# Patient Record
Sex: Female | Born: 2000 | Race: White | Hispanic: No
Health system: Western US, Academic
[De-identification: ages and names within clinical notes are randomized; demographics above are authoritative.]

## PROBLEM LIST (undated history)

## (undated) HISTORY — PX: BREAST RECONSTRUCTION: SHX5075

---
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
## 2018-02-19 ENCOUNTER — Ambulatory Visit (INDEPENDENT_AMBULATORY_CARE_PROVIDER_SITE_OTHER): Payer: No Typology Code available for payment source | Admitting: Family Medicine

## 2018-02-19 ENCOUNTER — Encounter: Payer: Self-pay | Admitting: Family Medicine

## 2018-02-19 ENCOUNTER — Other Ambulatory Visit: Payer: Self-pay | Admitting: Family Medicine

## 2018-02-19 VITALS — BP 103/52 | HR 49 | Temp 98.5°F | Resp 14 | Wt 191.0 lb

## 2018-02-19 DIAGNOSIS — N911 Secondary amenorrhea: Secondary | ICD-10-CM

## 2018-02-19 NOTE — Progress Notes (Addendum)
Patient presents today with symptoms of amenorrhea.  Patient states that she has had no menstrual period since November 27, 2017.  She denies being sexually active.  She admits to having symptoms related to her menstrual cycle but no bleeding.  Patient has changed her diet over the last year with preparing food mostly at home.  She has lost approximately 30 pounds over the course of the year.  She attributes this to mostly increased activity with basketball.  She denies any history of stress injury.  She denies any problems with her sleep or appetite.  She does admit to some constipation.  She denies any abdominal pain, chest pain, shortness of breath, headache, vision changes, breast changes, nipple discharge, nausea, vomiting.  She denies any restrictive or binging/purging behavior.  She does admit that she is doing more basketball activity than she ever has done before.  She has met with a nutritionist who encouraged her to come and see me to talk about her amenorrhea and to do some labs.  ROS: Negative except mentioned above. Vitals as per Epic.  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  Will follow-up with patient after lab results have been reviewed.

## 2018-02-20 LAB — CBC WITH DIFFERENTIAL/PLATELET
BASOS: 0 %
Basophils Absolute: 0 10*3/uL (ref 0.0–0.2)
EOS (ABSOLUTE): 0.1 10*3/uL (ref 0.0–0.4)
EOS: 1 %
HEMATOCRIT: 34.1 % (ref 34.0–46.6)
Hemoglobin: 11.7 g/dL (ref 11.1–15.9)
Immature Grans (Abs): 0 10*3/uL (ref 0.0–0.1)
Immature Granulocytes: 0 %
LYMPHS ABS: 1.4 10*3/uL (ref 0.7–3.1)
Lymphs: 16 %
MCH: 29.9 pg (ref 26.6–33.0)
MCHC: 34.3 g/dL (ref 31.5–35.7)
MCV: 87 fL (ref 79–97)
MONOS ABS: 0.4 10*3/uL (ref 0.1–0.9)
Monocytes: 4 %
Neutrophils Absolute: 6.5 10*3/uL (ref 1.4–7.0)
Neutrophils: 79 %
Platelets: 318 10*3/uL (ref 150–450)
RBC: 3.91 x10E6/uL (ref 3.77–5.28)
RDW: 12.2 % — AB (ref 12.3–15.4)
WBC: 8.4 10*3/uL (ref 3.4–10.8)

## 2018-02-20 LAB — COMPREHENSIVE METABOLIC PANEL
A/G RATIO: 1.9 (ref 1.2–2.2)
ALK PHOS: 58 IU/L (ref 39–117)
ALT: 15 IU/L (ref 0–32)
AST: 20 IU/L (ref 0–40)
Albumin: 4.3 g/dL (ref 3.5–5.5)
BUN/Creatinine Ratio: 14 (ref 9–23)
BUN: 12 mg/dL (ref 6–20)
Bilirubin Total: 0.4 mg/dL (ref 0.0–1.2)
CHLORIDE: 102 mmol/L (ref 96–106)
CO2: 21 mmol/L (ref 20–29)
Calcium: 8.9 mg/dL (ref 8.7–10.2)
Creatinine, Ser: 0.88 mg/dL (ref 0.57–1.00)
GFR calc Af Amer: 110 mL/min/{1.73_m2} (ref >59)
GFR, EST NON AFRICAN AMERICAN: 96 mL/min/{1.73_m2} (ref >59)
GLOBULIN, TOTAL: 2.3 g/dL (ref 1.5–4.5)
Glucose: 75 mg/dL (ref 65–99)
POTASSIUM: 4.1 mmol/L (ref 3.5–5.2)
SODIUM: 140 mmol/L (ref 134–144)
Total Protein: 6.6 g/dL (ref 6.0–8.5)

## 2018-02-20 LAB — FSH/LH
FSH: 7.8 m[IU]/mL
LH: 5.9 m[IU]/mL

## 2018-02-20 LAB — VITAMIN D 25 HYDROXY (VIT D DEFICIENCY, FRACTURES): VIT D 25 HYDROXY: 25.8 ng/mL — AB (ref 30.0–100.0)

## 2018-02-20 LAB — TSH+FREE T4
Free T4: 1.04 ng/dL (ref 0.93–1.60)
TSH: 0.857 u[IU]/mL (ref 0.450–4.500)

## 2018-02-20 LAB — ESTRADIOL: Estradiol: 14.8 pg/mL

## 2018-03-08 ENCOUNTER — Other Ambulatory Visit: Payer: Self-pay | Admitting: Family Medicine

## 2018-03-08 DIAGNOSIS — N911 Secondary amenorrhea: Secondary | ICD-10-CM

## 2018-03-12 ENCOUNTER — Encounter: Payer: Self-pay | Admitting: Obstetrics and Gynecology

## 2018-03-21 ENCOUNTER — Encounter: Payer: Self-pay | Admitting: Obstetrics and Gynecology

## 2018-03-23 ENCOUNTER — Ambulatory Visit: Payer: PRIVATE HEALTH INSURANCE | Admitting: Certified Nurse Midwife

## 2018-03-23 ENCOUNTER — Encounter: Payer: Self-pay | Admitting: Certified Nurse Midwife

## 2018-03-23 VITALS — BP 100/55 | HR 56 | Ht 73.0 in | Wt 196.0 lb

## 2018-03-23 DIAGNOSIS — N911 Secondary amenorrhea: Secondary | ICD-10-CM | POA: Diagnosis not present

## 2018-03-23 NOTE — Patient Instructions (Addendum)
Secondary Amenorrhea  Secondary amenorrhea occurs when a female who was previously having menstrual periods has not had them for 3-6 months. A menstrual period is the monthly shedding of the lining of the uterus. Menstruation involves the passing of blood, tissue, fluid, and mucus through the vagina. The flow of blood usually occurs during 3-7 consecutive days each month. This condition has many causes. In many cases, treating the underlying cause will return menstrual periods back to a normal cycle. What are the causes? The most common cause of this condition is pregnancy. Other causes include:  Malnutrition.  Cirrhosis of the liver.  Conditions of the blood.  Diabetes.  Epilepsy.  Chronic kidney disease.  Polycystic ovary disease.  Stress or anxiety.  A hormonal imbalance.  Ovarian failure.  Medicines.  Extreme obesity.  Cystic fibrosis.  Low body weight or drastic weight loss.  Early menopause.  Removal of the ovaries or uterus.  Contraceptive pills, patches, or vaginal rings.  Cushing syndrome.  Thyroid problems. What increases the risk? You are more likely to develop this condition if:  You have a family history of this condition.  You have an eating disorder.  You do extreme athletic training.  You have a chronic disease.  You abuse substances such as alcohol or cigarettes. What are the signs or symptoms? The main symptom of this condition is a lack of menstrual periods for 3-6 months. How is this diagnosed? This condition may be diagnosed based on:  Your medical history.  A physical exam.  A pelvic exam to check for problems with your reproductive organs.  A procedure to examine the uterus.  A measurement of your body mass index (BMI).  Tests, such as: ? Blood tests that measure certain hormones in your body and rule out pregnancy. ? Urine tests. ? Imaging tests, such as an ultrasound, CT scan, or MRI. How is this treated? Treatment  for this condition depends on the cause of the amenorrhea. It may involve:  Correcting dietary problems.  Treating underlying conditions.  Medicines.  Lifestyle changes.  Surgery. If the condition cannot be corrected, it is sometimes possible to trigger menstrual periods with medicines. Follow these instructions at home: Lifestyle  Maintain a healthy diet. Ask to meet with a registered dietitian for nutrition counseling and meal planning.  Maintain a healthy weight. Talk to your health care provider before trying any new diet or exercise plan.  Exercise at least 30 minutes 5 or more days each week. Exercising includes brisk walking, yard work, biking, running, swimming, and team sports like basketball and soccer. Ask your health care provider which exercises are safe for you.  Get enough sleep. Plan your sleep time to allow for 7-9 hours of sleep each night.  Learn to manage stress. Explore relaxation techniques such as meditation, journaling, yoga, or tai chi. General instructions  Be aware of changes in your menstrual cycle. Keep a record of when you have your menstrual period. Note the date your period starts, how long it lasts, and any problems you experience.  Take over-the-counter and prescription medicines only as told by your health care provider.  Keep all follow-up visits as told by your health care provider. This is important. Contact a health care provider if:  Your periods do not return to normal after treatment. Summary  Secondary amenorrhea is when a female who was previously having menstrual periods has not gotten her period for 3-6 months.  This condition has many causes. In many cases, treating the underlying   cause will return menstrual periods back to a normal cycle.  Talk to your health care provider if your periods do not return to normal after treatment. This information is not intended to replace advice given to you by your health care provider. Make  sure you discuss any questions you have with your health care provider. Document Released: 05/02/2006 Document Revised: 06/09/2016 Document Reviewed: 06/09/2016 Elsevier Interactive Patient Education  2019 [employer]. Medroxyprogesterone tablets What is this medicine? MEDROXYPROGESTERONE (me DROX ee proe JES te rone) is a hormone in a class called progestins. It is commonly used to prevent the uterine lining from overgrowth in women taking an estrogen after menopause. It is also used to treat irregular menstrual bleeding or a lack of menstrual bleeding in women. This medicine may be used for other purposes; ask your health care provider or pharmacist if you have questions. COMMON BRAND NAME(S): Amen, Provera What should I tell my health care provider before I take this medicine? They need to know if you have any of these conditions: -blood vessel disease or a history of a blood clot in the lungs or legs -breast, cervical or vaginal cancer -heart disease -kidney disease -liver disease -migraine -recent miscarriage or abortion -mental depression -migraine -seizures (convulsions) -stroke -vaginal bleeding that has not been evaluated -an unusual or allergic reaction to medroxyprogesterone, other medicines, foods, dyes, or preservatives -pregnant or trying to get pregnant -breast-feeding How should I use this medicine? Take this medicine by mouth with a glass of water. Follow the directions on the prescription label. Take your doses at regular intervals. Do not take your medicine more often than directed. Talk to your pediatrician regarding the use of this medicine in children. Special care may be needed. While this drug may be prescribed for children as young as 13 years for selected conditions, precautions do apply. Overdosage: If you think you have taken too much of this medicine contact a poison control center or emergency room at once. NOTE: This medicine is only for you. Do not share  this medicine with others. What if I miss a dose? If you miss a dose, take it as soon as you can. If it is almost time for your next dose, take only that dose. Do not take double or extra doses. What may interact with this medicine? -barbiturate medicines for inducing sleep or treating seizures (convulsions) -bosentan -carbamazepine -phenytoin -rifampin -St. John's Wort This list may not describe all possible interactions. Give your health care provider a list of all the medicines, herbs, non-prescription drugs, or dietary supplements you use. Also tell them if you smoke, drink alcohol, or use illegal drugs. Some items may interact with your medicine. What should I watch for while using this medicine? Visit your health care professional for regular checks on your progress. You will need a regular breast and pelvic exam. If you have any reason to think you are pregnant, stop taking this medicine at once and contact your doctor or health care professional. What side effects may I notice from receiving this medicine? Side effects that you should report to your doctor or health care professional as soon as possible: -breast tenderness or discharge -changes in mood or emotions, such as depression -changes in vision or speech -pain in the abdomen, chest, groin, or leg -severe headache -skin rash, itching, or hives -sudden shortness of breath -unusually weak or tired -yellowing of skin or eyes Side effects that usually do not require medical attention (report to your doctor or health  care professional if they continue or are bothersome): -acne -change in menstrual bleeding pattern or flow -changes in sexual desire -facial hair growth -fluid retention and swelling -headache -upset stomach -weight gain or loss This list may not describe all possible side effects. Call your doctor for medical advice about side effects. You may report side effects to FDA at 1-800-FDA-1088. Where should I keep  my medicine? Keep out of the reach of children. Store at room temperature between 20 and 25 degrees C (68 and 77 degrees F). Throw away any unused medicine after the expiration date. NOTE: This sheet is a summary. It may not cover all possible information. If you have questions about this medicine, talk to your doctor, pharmacist, or health care provider.  2019 Elsevier/Gold Standard (2008-03-20 11:26:12)

## 2018-03-23 NOTE — Progress Notes (Signed)
GYN ENCOUNTER NOTE  Subjective:       Patricia Ballard is a 17 y.o. G0P0000 female is here for gynecologic evaluation of the following issues:  1. Secondary amenorrhea  [facility]. Labs collected. Reports 35 pound weight loss over the last year.   Patient's mother contacted gynecologist at home (she's an Geographical information systems officerinternational student) and was given progesterone which the patient has not started yet.   Denies difficulty breathing or respiratory distress, chest pain, abdominal pain, vaginal bleeding, dysuria, and leg pain or swelling.    Gynecologic History  Patient's last menstrual period was 11/27/2017 (exact date).  Period Cycle (Days): 28 Period Duration (Days): 5 Period Pattern: Regular Menstrual Flow: Moderate Menstrual Control: Maxi pad Dysmenorrhea: None  Contraception: abstinence  Last Pap: N/A.   Obstetric History  OB History  Gravida Para Term Preterm AB Living  0 0 0 0 0 0  SAB TAB Ectopic Multiple Live Births  0 0 0 0 0    No past medical history on file.  Past Surgical History:  Procedure Laterality Date  . TONSILLECTOMY  2007    Current Outpatient Medications on File Prior to Visit  Medication Sig Dispense Refill  . cholecalciferol (VITAMIN D3) 25 MCG (1000 UT) tablet Take 2,000 Units by mouth daily.     No current facility-administered medications on file prior to visit.     No Known Allergies  Social History   Socioeconomic History  . Marital status: Single    Spouse name: Not on file  . Number of children: Not on file  . Years of education: Not on file  . Highest education level: Not on file  Occupational History  . Not on file  Social Needs  . Financial resource strain: Not on file  . Food insecurity:    Worry: Not on file    Inability: Not on file  . Transportation needs:    Medical: Not on file    Non-medical: Not on file  Tobacco Use  . Smoking status: Never Smoker  . Smokeless tobacco: Never Used   Substance and Sexual Activity  . Alcohol use: Never    Frequency: Never  . Drug use: Never  . Sexual activity: Never    Birth control/protection: None  Lifestyle  . Physical activity:    Days per week: Not on file    Minutes per session: Not on file  . Stress: Not on file  Relationships  . Social connections:    Talks on phone: Not on file    Gets together: Not on file    Attends religious service: Not on file    Active member of club or organization: Not on file    Attends meetings of clubs or organizations: Not on file    Relationship status: Not on file  . Intimate partner violence:    Fear of current or ex partner: Not on file    Emotionally abused: Not on file    Physically abused: Not on file    Forced sexual activity: Not on file  Other Topics Concern  . Not on file  Social History Narrative  . Not on file    Family History  Problem Relation Age of Onset  . Breast cancer Maternal Grandmother   . Ovarian cancer Neg Hx   . Colon cancer Neg Hx     The following portions of the patient's history were reviewed and updated as appropriate: allergies, current medications, past family history,  past medical history, past social history, past surgical history and problem list.  Review of Systems  ROS negative except as noted above. Information obtained from patient.   Objective:   BP (!) 100/55   Pulse (!) 56   Ht 6\' 1"  (1.854 m)   Wt 196 lb (88.9 kg)   LMP 11/27/2017 (Exact Date)   BMI 25.86 kg/m   CONSTITUTIONAL: Well-developed, well-nourished female in no acute distress.   Recent Results (from the past 2160 hour(s))  Estradiol     Status: None   Collection Time: 02/19/18  1:31 PM  Result Value Ref Range   Estradiol 14.8 pg/mL    Comment:                     Adult Female:                       Follicular phase   12.5 -   166.0                       Ovulation phase    85.8 -   498.0                       Luteal phase       43.8 -   211.0                        Postmenopausal     <6.0 -    54.7                     Pregnancy                       1st trimester     215.0 - >4300.0                     Girls (1-10 years)    6.0 -    27.0 Roche ECLIA methodology   FSH/LH     Status: None   Collection Time: 02/19/18  1:31 PM  Result Value Ref Range   LH 5.9 mIU/mL    Comment:                     Adult Female:                       Follicular phase      2.4 -  12.6                       Ovulation phase      14.0 -  95.6                       Luteal phase          1.0 -  11.4                       Postmenopausal        7.7 -  58.5    FSH 7.8 mIU/mL    Comment:                     Adult Female:                       Follicular  phase      3.5 -  12.5                       Ovulation phase       4.7 -  21.5                       Luteal phase          1.7 -   7.7                       Postmenopausal       25.8 - 134.8   TSH + free T4     Status: None   Collection Time: 02/19/18  1:31 PM  Result Value Ref Range   TSH 0.857 0.450 - 4.500 uIU/mL   Free T4 1.04 0.93 - 1.60 ng/dL  CBC with Differential     Status: Abnormal   Collection Time: 02/19/18  1:31 PM  Result Value Ref Range   WBC 8.4 3.4 - 10.8 x10E3/uL   RBC 3.91 3.77 - 5.28 x10E6/uL   Hemoglobin 11.7 11.1 - 15.9 g/dL   Hematocrit 60.4 54.0 - 46.6 %   MCV 87 79 - 97 fL   MCH 29.9 26.6 - 33.0 pg   MCHC 34.3 31.5 - 35.7 g/dL   RDW 98.1 (L) 19.1 - 47.8 %   Platelets 318 150 - 450 x10E3/uL   Neutrophils 79 Not Estab. %   Lymphs 16 Not Estab. %   Monocytes 4 Not Estab. %   Eos 1 Not Estab. %   Basos 0 Not Estab. %   Neutrophils Absolute 6.5 1.4 - 7.0 x10E3/uL   Lymphocytes Absolute 1.4 0.7 - 3.1 x10E3/uL   Monocytes Absolute 0.4 0.1 - 0.9 x10E3/uL   EOS (ABSOLUTE) 0.1 0.0 - 0.4 x10E3/uL   Basophils Absolute 0.0 0.0 - 0.2 x10E3/uL   Immature Granulocytes 0 Not Estab. %   Immature Grans (Abs) 0.0 0.0 - 0.1 x10E3/uL  Comprehensive metabolic panel     Status: None   Collection Time:  02/19/18  1:31 PM  Result Value Ref Range   Glucose 75 65 - 99 mg/dL   BUN 12 6 - 20 mg/dL   Creatinine, Ser 2.95 0.57 - 1.00 mg/dL   GFR calc non Af Amer 96 >59 mL/min/1.73   GFR calc Af Amer 110 >59 mL/min/1.73   BUN/Creatinine Ratio 14 9 - 23   Sodium 140 134 - 144 mmol/L   Potassium 4.1 3.5 - 5.2 mmol/L   Chloride 102 96 - 106 mmol/L   CO2 21 20 - 29 mmol/L   Calcium 8.9 8.7 - 10.2 mg/dL   Total Protein 6.6 6.0 - 8.5 g/dL   Albumin 4.3 3.5 - 5.5 g/dL   Globulin, Total 2.3 1.5 - 4.5 g/dL   Albumin/Globulin Ratio 1.9 1.2 - 2.2   Bilirubin Total 0.4 0.0 - 1.2 mg/dL   Alkaline Phosphatase 58 39 - 117 IU/L   AST 20 0 - 40 IU/L   ALT 15 0 - 32 IU/L  Vitamin D, 25-hydroxy     Status: Abnormal   Collection Time: 02/19/18  1:31 PM  Result Value Ref Range   Vit D, 25-Hydroxy 25.8 (L) 30.0 - 100.0 ng/mL    Comment: Vitamin D deficiency has been defined by the Institute of Medicine and an Endocrine Society practice guideline as a level of serum 25-OH vitamin D less than 20  ng/mL (1,2). The Endocrine Society went on to further define vitamin D insufficiency as a level between 21 and 29 ng/mL (2). 1. IOM (Institute of Medicine). 2010. Dietary reference    intakes for calcium and D. [place]: The    [employer]. 2. Holick MF, Binkley [place], Bischoff-Ferrari HA, et al.    Evaluation, treatment, and prevention of vitamin D    deficiency: an Endocrine Society clinical practice    guideline. JCEM. 2011 Jul; 96(7):1911-30.     Assessment:   1. Secondary amenorrhea   Plan:   Lab results and treatment options reviewed with patient, verbalized understanding.   Will start Provera Challenge. Sample packet of Lo Loestrin given to start after withdrawal bleed.   Encouraged to activate MyChart.   Reviewed red flag symptoms and when to call.   RTC as needed.    Gunnar BullaJenkins Michelle Salah Nakamura, CNM [facility], [facility] 03/23/18 2:05 PM

## 2018-03-23 NOTE — Progress Notes (Signed)
Patient with history of normal menstrual periods but no menses since 11/27/2017.  Patient had normal labs 02/19/2018 at PCP.

## 2018-04-05 ENCOUNTER — Encounter: Payer: Self-pay | Admitting: Certified Nurse Midwife

## 2018-04-12 ENCOUNTER — Encounter: Payer: Self-pay | Admitting: Certified Nurse Midwife

## 2018-04-12 ENCOUNTER — Other Ambulatory Visit: Payer: Self-pay

## 2018-04-12 MED ORDER — MEDROXYPROGESTERONE ACETATE 5 MG PO TABS: 5.0000 mg | ORAL_TABLET | Freq: Every day | ORAL | 6 refills | Status: DC

## 2018-05-14 ENCOUNTER — Encounter: Payer: Self-pay | Admitting: Certified Nurse Midwife

## 2018-12-25 ENCOUNTER — Other Ambulatory Visit: Payer: Self-pay

## 2018-12-25 DIAGNOSIS — Z20822 Contact with and (suspected) exposure to covid-19: Secondary | ICD-10-CM

## 2018-12-26 LAB — NOVEL CORONAVIRUS, NAA: SARS-CoV-2, NAA: NOT DETECTED

## 2019-03-12 ENCOUNTER — Encounter: Payer: Self-pay | Admitting: Certified Nurse Midwife

## 2019-03-19 ENCOUNTER — Encounter: Payer: Self-pay | Admitting: Certified Nurse Midwife

## 2019-03-25 ENCOUNTER — Other Ambulatory Visit: Payer: Self-pay

## 2019-03-25 MED ORDER — MEDROXYPROGESTERONE ACETATE 5 MG PO TABS: 5.0000 mg | ORAL_TABLET | Freq: Every day | ORAL | 3 refills | Status: AC

## 2019-04-08 ENCOUNTER — Encounter: Payer: PRIVATE HEALTH INSURANCE | Admitting: Certified Nurse Midwife

## 2020-03-30 ENCOUNTER — Other Ambulatory Visit: Payer: No Typology Code available for payment source

## 2020-03-30 DIAGNOSIS — Z20822 Contact with and (suspected) exposure to covid-19: Secondary | ICD-10-CM

## 2020-04-01 LAB — NOVEL CORONAVIRUS, NAA: SARS-CoV-2, NAA: DETECTED — AB

## 2020-04-01 LAB — SARS-COV-2, NAA 2 DAY TAT

## 2020-11-05 HISTORY — PX: BREAST AUGMENTATION: SHX10734

## 2021-05-03 NOTE — Progress Notes (Signed)
[facility] CLINIC  NEW GYNECOLOGY VISIT    Patient Referred By: No ref. provider found  Patient's PCP: No primary care provider on file.    Patient's preferred name: Kathy Benitez  Patient's pronouns: she/her       Chief Complaint   Patient presents with    New Patient     HPV vaccine         Subjective:     Kathy Benitez is a 21 year old female who presents on 05/04/2021.    Never been sexually active.  Periods are regular.  No concerns today, just here for HPV vaccination at the recommendation of her mother.      OB HISTORY   OB History     Gravida   0    Para   0    Term   0    Preterm   0    AB   0    Living   0       SAB   0    IAB   0    Ectopic   0    Multiple   0    Live Births   0                    Objective:    Vitals: BP 111/73    Pulse 70    Temp 36.7 C (Temporal)    Ht 6\' 1"  (1.854 m)    Wt (!) 100 kg (220 lb 7.4 oz)    LMP 04/27/2021 (Exact Date)    SpO2 96%    BMI 29.09 kg/m   Physical Exam  Gen: well appearing, NAD, ambulates without assistance   Breasts: deferred  Cardiovascular: normal HR. No cyanosis, clubbing or edema.  Respiratory: normal respiratory effort on room air, no use of accessory muscles  Abdomen: deferred  Neurological: alert and oriented x 3  Psychiatric: bright and reactive affect, normal mood.  MSK: grossly normal range of motion    Exam chaperoned by not requested       Assessment and Plan:      Kathy Benitez is a 21 year old G0 who presents for HPV vaccination    #HPV vaccination  -Series started today, return in 2 and 6 months for additional doses.    # HCM:  - Cervical cancer screening: Offered first pap today, however patient has never had intercourse and declines at this time. We recommend starting at age 76 regardless of coitarche, but will defer given patient preference.  - HPV Vaccine: as above  - Contraception: abstinence  - STI testing: declines  - Mammogram: not yet indicated  - COVID vaccine: up to date per pt    Follow Up Plan: per vaccination schedule, and  annually or sooner PRN    36, MD   OB-GYN Attending Physician

## 2021-05-04 ENCOUNTER — Ambulatory Visit
Payer: No Typology Code available for payment source | Attending: Student in an Organized Health Care Education/Training Program | Admitting: Student in an Organized Health Care Education/Training Program

## 2021-05-04 ENCOUNTER — Encounter (HOSPITAL_BASED_OUTPATIENT_CLINIC_OR_DEPARTMENT_OTHER): Payer: Self-pay | Admitting: Student in an Organized Health Care Education/Training Program

## 2021-05-04 VITALS — BP 111/73 | HR 70 | Temp 98.1°F | Ht 73.0 in | Wt 220.5 lb

## 2021-05-04 DIAGNOSIS — Z23 Encounter for immunization: Secondary | ICD-10-CM | POA: Insufficient documentation

## 2021-05-04 DIAGNOSIS — Z7185 Encounter for immunization safety counseling: Secondary | ICD-10-CM | POA: Insufficient documentation

## 2021-05-04 MED ORDER — HPV 9-VALENT RECOMB VACCINE IM SUSY
0.5000 mL | PREFILLED_SYRINGE | Freq: Once | INTRAMUSCULAR | Status: AC
Start: 2021-05-04 — End: 2021-05-04
  Administered 2021-05-04: 0.5 mL via INTRAMUSCULAR

## 2021-05-04 MED ORDER — HPV 9-VALENT RECOMB VACCINE IM SUSY
PREFILLED_SYRINGE | INTRAMUSCULAR | Status: AC
Start: 2021-05-04 — End: 2021-05-04
  Filled 2021-05-04: qty 0.5

## 2021-05-04 MED ORDER — HPV 9-VALENT RECOMB VACCINE IM SUSY
0.5000 mL | PREFILLED_SYRINGE | Freq: Once | INTRAMUSCULAR | Status: AC
Start: 2021-06-03 — End: 2021-07-01
  Administered 2021-07-01: 0.5 mL via INTRAMUSCULAR

## 2021-05-04 MED ORDER — HPV 9-VALENT RECOMB VACCINE IM SUSY
0.5000 mL | PREFILLED_SYRINGE | Freq: Once | INTRAMUSCULAR | Status: AC
Start: 2021-10-31 — End: 2021-10-29
  Administered 2021-10-29: 0.5 mL via INTRAMUSCULAR

## 2021-06-22 ENCOUNTER — Encounter (HOSPITAL_BASED_OUTPATIENT_CLINIC_OR_DEPARTMENT_OTHER)
Payer: No Typology Code available for payment source | Admitting: Student in an Organized Health Care Education/Training Program

## 2021-06-28 ENCOUNTER — Telehealth (HOSPITAL_BASED_OUTPATIENT_CLINIC_OR_DEPARTMENT_OTHER): Payer: Self-pay | Admitting: Student in an Organized Health Care Education/Training Program

## 2021-06-28 NOTE — Telephone Encounter (Signed)
Patient called and wants to have an appointment re-scheduled with Dr Levy Sjogren for her second HPV shot as soon as possible. [facility] attempted to schedule but soonest is not until May. Patient needs to be seen sooner than that. Please call patient as soon as possible. Thanks!

## 2021-06-28 NOTE — Telephone Encounter (Signed)
RETURN CALL: MyChart reply      SUBJECT:  General Message     MESSAGE: Patient returning call for reschedule - she is hoping to be rescheduled this week;  available for an appointment this Tues 03/28 afternoon, Wed before 9:30am, or Friday before 12pm. If possible, please schedule appointment within those times (if available) and reply to patient via mychart. Call back is also okay.

## 2021-06-28 NOTE — Telephone Encounter (Signed)
LM

## 2021-06-28 NOTE — Telephone Encounter (Signed)
RETURN CALL: Voicemail - Detailed Message      SUBJECT:  General Message     MESSAGE: Patient called back to requesting to reschedule.

## 2021-06-29 ENCOUNTER — Encounter (HOSPITAL_BASED_OUTPATIENT_CLINIC_OR_DEPARTMENT_OTHER)
Payer: No Typology Code available for payment source | Admitting: Student in an Organized Health Care Education/Training Program

## 2021-06-29 NOTE — Telephone Encounter (Signed)
Scheduled patient on 07/01/21 for HPV 2nd dose.

## 2021-07-01 ENCOUNTER — Ambulatory Visit
Payer: No Typology Code available for payment source | Attending: Student in an Organized Health Care Education/Training Program

## 2021-07-01 DIAGNOSIS — Z23 Encounter for immunization: Secondary | ICD-10-CM | POA: Insufficient documentation

## 2021-07-01 MED ORDER — HPV 9-VALENT RECOMB VACCINE IM SUSY
PREFILLED_SYRINGE | INTRAMUSCULAR | Status: AC
Start: 2021-07-01 — End: 2021-07-01
  Filled 2021-07-01: qty 0.5

## 2021-07-01 NOTE — Progress Notes (Signed)
Pt confirms name, DOB, allergies, and reason for visit.  All questions answered.  Administered HPV Gardisol  as ordered. Scheduled pt to RTC on 10/29/2021. Pt tolerated well and encouraged to call/msg prn.

## 2021-08-03 ENCOUNTER — Ambulatory Visit (HOSPITAL_BASED_OUTPATIENT_CLINIC_OR_DEPARTMENT_OTHER)
Payer: No Typology Code available for payment source | Admitting: Student in an Organized Health Care Education/Training Program

## 2021-09-23 ENCOUNTER — Ambulatory Visit (INDEPENDENT_AMBULATORY_CARE_PROVIDER_SITE_OTHER)
Payer: No Typology Code available for payment source | Admitting: Student in an Organized Health Care Education/Training Program

## 2021-09-23 ENCOUNTER — Encounter (INDEPENDENT_AMBULATORY_CARE_PROVIDER_SITE_OTHER): Payer: Self-pay | Admitting: Student in an Organized Health Care Education/Training Program

## 2021-09-23 VITALS — BP 105/64

## 2021-09-23 DIAGNOSIS — M419 Scoliosis, unspecified: Secondary | ICD-10-CM | POA: Insufficient documentation

## 2021-09-23 DIAGNOSIS — M545 Low back pain, unspecified: Secondary | ICD-10-CM

## 2021-09-23 NOTE — Progress Notes (Signed)
SPORTS MEDICINE CLINIC NOTE    Kathy Benitez  [id number]  09/23/21    ID and Chief Complaint  Kathy Benitez is a 21 year old female who comes to clinic today primarily to discuss right lower back pain (1).    History of Present Complaint(s)  1.  States that this initially started on 09/20/2021.  Woke up with pain in the right lower back and has gotten worse since that time.  Pain is described as sharp and stabbing.  They rate the pain as a 6/10.  Symptoms are most severe in the morning and can interrupt sleep.  Improved with light activity like walking and some yoga exercises.  They deny any saddle anesthesia, urinary retention, or bowel or bladder incontinence.  No numbness or tingling into the bilateral lower extremities.    Pertinent medical history: Scoliosis    Review of Systems  RHEUMATOLOGIC/MSK: As per HPI    Problem list, medications, and allergies reviewed in epic charting     Physical Exam  Vitals:    09/23/21 0853   BP: 105/64   Pulse: (P) 79       GENERAL: No distress. Pleasant and cooperative with exam.    MUSCULOSKELETAL EXAM (Focus on lower back)  Visualization:  (+) S-curve spine  Palpation:  -- (+) Midline lumbosacral spine, (-) Paraspinal muscle tenderness  (-) Right SI joint, (-) Left SI joint   Range of motion:  -- Decreased lumbar spine range of motion in flexion and extension.  --Normal lumbar spine range of motion with lateral sidebending and rotation  Strength:  -- 5/5 hip flexion (L2/3) on the right, 5/5 hip flexion (L2) on the left  -- 5/5 hip adduction (L2/3) on the right, 5/5 hip adduction (L2/3) on the left  -- 5/5 knee extension (L4) on the right, 5/5 knee extension (L4) on the left  -- 5/5 dorsiflexion (L4/5) on the right, 5/5 dorsiflexion (L4/5) on the left  -- 5/5 great toe dorsiflexion (L5) on the right, 5/5 great toe dorsiflexion (L5) on the left  -- 5/5 foot inversion (L5) on the right, 5/5 foot inversion (L5) on the left  -- 5/5 foot eversion (S1) on the right, 5/5 foot eversion (S1)  on the left  -- 5/5 foot plantarflexion (S1) on the right, 5/5 foot plantarflexion (S1) on the left  Special tests:  -- (-) Straight leg raise--right, (-) Straight leg raise--left, (-) Slump test--right  (-) Slump test--left    Neurovascular:  -- Sensation intact to light touch throughout anteromedial thigh (L2/3), lateral thigh / anterior knee / medial lower leg (L4), lateral leg / dorsal foot (L5), posterior lower leg / lateral foot (S1)  -- Patellar reflexes (L3, L4) 2+ bilaterally  -- Ankle jerk reflexes (S1, S2) 2+ bilaterally    ______________________________________________________________________    Assessment/Plan  Kathy Benitez is a 21 year old female presenting with     (M54.50) Lumbar back pain  (R) (primary encounter diagnosis): Broad differential diagnosis including muscular flare from scoliosis, disc related pathology, or other.  No red flag symptoms currently on history or physical exam.  Plan: Referral to Physical Therapy -         Ortho/Sports/Musculoskeletal  At home stretching and exercise handout provided to the patient at this visit  Continue light easy gentle exercises  Can continue to utilize [place] cream as it is helpful  Can use OTC medications like Tylenol and ibuprofen  Can continue to utilize heat as this is helpful  Could use  a foam roller or lacrosse ball for tight muscles  Could try other over-the-counter creams, gels, and patches like Arnica, capsaicin, Tiger balm, Salonpas, or lidocaine patches.  If symptoms are not improving with conservative treatment and physical therapy after 6 to 12 weeks would recommend follow-up for repeat evaluation and consideration of imaging.    This note was completed at least in part with voice recognition software      Patsy Lager, DO  Primary Care Sports Medicine [facility]  Clinical Faculty [facility] [employer] [facility]  Board Certified [facility]   Board Certified Sports Medicine

## 2021-10-08 ENCOUNTER — Encounter (HOSPITAL_COMMUNITY): Payer: Self-pay

## 2021-10-19 ENCOUNTER — Encounter (INDEPENDENT_AMBULATORY_CARE_PROVIDER_SITE_OTHER): Payer: No Typology Code available for payment source | Admitting: Internal Medicine

## 2021-10-29 ENCOUNTER — Ambulatory Visit (HOSPITAL_BASED_OUTPATIENT_CLINIC_OR_DEPARTMENT_OTHER): Payer: No Typology Code available for payment source

## 2021-10-29 ENCOUNTER — Ambulatory Visit
Payer: No Typology Code available for payment source | Attending: Student in an Organized Health Care Education/Training Program

## 2021-10-29 DIAGNOSIS — Z23 Encounter for immunization: Secondary | ICD-10-CM | POA: Insufficient documentation

## 2021-10-29 MED ORDER — HPV 9-VALENT RECOMB VACCINE IM SUSY
PREFILLED_SYRINGE | INTRAMUSCULAR | Status: AC
Start: 2021-10-29 — End: 2021-10-29
  Filled 2021-10-29: qty 0.5

## 2021-10-29 NOTE — Progress Notes (Signed)
Pt confirms name, DOB, allergies, and reason for visit.  All questions answered.  Administered HPV as ordered. Pt tolerated well and encouraged to call/msg prn.
